# Patient Record
Sex: Male | Born: 1995 | Race: White | Hispanic: No | Marital: Single | Smoking: Never smoker
Health system: Southern US, Community
[De-identification: ages and names within clinical notes are randomized; demographics above are authoritative.]

## PROBLEM LIST (undated history)

## (undated) DIAGNOSIS — J45909 Unspecified asthma, uncomplicated: Secondary | ICD-10-CM

## (undated) DIAGNOSIS — M92529 Juvenile osteochondrosis of tibia tubercle, unspecified leg: Secondary | ICD-10-CM

## (undated) DIAGNOSIS — M9250 Juvenile osteochondrosis of tibia and fibula, unspecified leg: Secondary | ICD-10-CM

---
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
## 2019-11-01 ENCOUNTER — Emergency Department (HOSPITAL_COMMUNITY): Payer: Medicaid Other

## 2019-11-01 ENCOUNTER — Encounter (HOSPITAL_COMMUNITY): Payer: Self-pay

## 2019-11-01 ENCOUNTER — Emergency Department (HOSPITAL_COMMUNITY)
Admission: EM | Admit: 2019-11-01 | Discharge: 2019-11-01 | Disposition: A | Discharge status: Home / Self Care | Payer: Medicaid Other | Attending: Emergency Medicine | Admitting: Emergency Medicine

## 2019-11-01 ENCOUNTER — Other Ambulatory Visit: Payer: Self-pay

## 2019-11-01 DIAGNOSIS — R109 Unspecified abdominal pain: Secondary | ICD-10-CM | POA: Diagnosis present

## 2019-11-01 DIAGNOSIS — J45909 Unspecified asthma, uncomplicated: Secondary | ICD-10-CM | POA: Insufficient documentation

## 2019-11-01 DIAGNOSIS — Z20822 Contact with and (suspected) exposure to covid-19: Secondary | ICD-10-CM | POA: Insufficient documentation

## 2019-11-01 DIAGNOSIS — N50811 Right testicular pain: Secondary | ICD-10-CM | POA: Diagnosis not present

## 2019-11-01 DIAGNOSIS — E876 Hypokalemia: Secondary | ICD-10-CM | POA: Diagnosis not present

## 2019-11-01 DIAGNOSIS — R52 Pain, unspecified: Secondary | ICD-10-CM

## 2019-11-01 LAB — CBC WITH DIFFERENTIAL/PLATELET
Abs Immature Granulocytes: 0.02 10*3/uL (ref 0.00–0.07)
Basophils Absolute: 0.1 10*3/uL (ref 0.0–0.1)
Basophils Relative: 1 %
Eosinophils Absolute: 0.1 10*3/uL (ref 0.0–0.5)
Eosinophils Relative: 1 %
HCT: 42.5 % (ref 39.0–52.0)
Hemoglobin: 13.9 g/dL (ref 13.0–17.0)
Immature Granulocytes: 0 %
Lymphocytes Relative: 19 %
Lymphs Abs: 1.8 10*3/uL (ref 0.7–4.0)
MCH: 28.2 pg (ref 26.0–34.0)
MCHC: 32.7 g/dL (ref 30.0–36.0)
MCV: 86.2 fL (ref 80.0–100.0)
Monocytes Absolute: 0.4 10*3/uL (ref 0.1–1.0)
Monocytes Relative: 5 %
Neutro Abs: 7 10*3/uL (ref 1.7–7.7)
Neutrophils Relative %: 74 %
Platelets: 213 10*3/uL (ref 150–400)
RBC: 4.93 MIL/uL (ref 4.22–5.81)
RDW: 13.2 % (ref 11.5–15.5)
WBC: 9.4 10*3/uL (ref 4.0–10.5)
nRBC: 0 % (ref 0.0–0.2)

## 2019-11-01 LAB — BASIC METABOLIC PANEL
Anion gap: 14 (ref 5–15)
BUN: 11 mg/dL (ref 6–20)
CO2: 25 mmol/L (ref 22–32)
Calcium: 9.5 mg/dL (ref 8.9–10.3)
Chloride: 103 mmol/L (ref 98–111)
Creatinine, Ser: 0.87 mg/dL (ref 0.61–1.24)
GFR calc Af Amer: >60 mL/min (ref >60)
GFR calc non Af Amer: >60 mL/min (ref >60)
Glucose, Bld: 137 mg/dL — ABNORMAL HIGH (ref 70–99)
Potassium: 3.3 mmol/L — ABNORMAL LOW (ref 3.5–5.1)
Sodium: 142 mmol/L (ref 135–145)

## 2019-11-01 LAB — URINALYSIS, ROUTINE W REFLEX MICROSCOPIC
Bilirubin Urine: NEGATIVE
Glucose, UA: NEGATIVE mg/dL
Hgb urine dipstick: NEGATIVE
Ketones, ur: 5 mg/dL — AB
Leukocytes,Ua: NEGATIVE
Nitrite: NEGATIVE
Protein, ur: NEGATIVE mg/dL
Specific Gravity, Urine: 1.025 (ref 1.005–1.030)
pH: 6 (ref 5.0–8.0)

## 2019-11-01 LAB — SARS CORONAVIRUS 2 BY RT PCR (HOSPITAL ORDER, PERFORMED IN ~~LOC~~ HOSPITAL LAB): SARS Coronavirus 2: POSITIVE — AB

## 2019-11-01 MED ORDER — HYDROMORPHONE HCL 1 MG/ML IJ SOLN
0.5000 mg | Freq: Once | INTRAMUSCULAR | Status: AC
  Administered 2019-11-01: 0.5 mg via INTRAVENOUS
  Filled 2019-11-01: qty 1

## 2019-11-01 MED ORDER — KETOROLAC TROMETHAMINE 30 MG/ML IJ SOLN
30.0000 mg | Freq: Once | INTRAMUSCULAR | Status: AC
  Administered 2019-11-01: 30 mg via INTRAVENOUS
  Filled 2019-11-01: qty 1

## 2019-11-01 MED ORDER — SODIUM CHLORIDE 0.9 % IV BOLUS
1000.0000 mL | Freq: Once | INTRAVENOUS | Status: AC
  Administered 2019-11-01: 1000 mL via INTRAVENOUS

## 2019-11-01 MED ORDER — ONDANSETRON HCL 4 MG/2ML IJ SOLN
4.0000 mg | Freq: Once | INTRAMUSCULAR | Status: AC
  Administered 2019-11-01: 4 mg via INTRAVENOUS
  Filled 2019-11-01: qty 2

## 2019-11-01 MED ORDER — MORPHINE SULFATE (PF) 4 MG/ML IV SOLN
4.0000 mg | Freq: Once | INTRAVENOUS | Status: AC
  Administered 2019-11-01: 4 mg via INTRAVENOUS
  Filled 2019-11-01: qty 1

## 2019-11-01 NOTE — ED Notes (Signed)
Per Nathan Lab Tech urine culture order collected/added on to specimen previously sent. ENMiles 

## 2019-11-01 NOTE — ED Provider Notes (Signed)
[facility]-EMERGENCY DEPT Provider Note   CSN: [id number] Arrival date & time: 11/01/19  1140    History Chief Complaint  Patient presents with  . Flank Pain    Bryan Bates is a 24 y.o. male with past medical history who presents for evaluation of right flank pain. Patient states this began earlier today. No history of kidney stones. No hematuria, dysuria. Has noted some left testicular pain. No urinary symptoms. Denies midline abdominal pain radiating to his back. Pain not worse with food intake. No nausea or vomiting. Denies fever, chills, chest pain, shortness of breath, testicular redness, swelling, concerns for STDs. No rashes or lesions. Denies aggravating or relieving factors. No recent trauma or injury. He is not take anything for symptoms. He rates his current pain a 10/10.  Symptoms started at 1000 this morning  Last PO intake with oranges at 0900.  History obtained from patient and past medical records. No interpretor was used.  HPI     Past Medical History:  Diagnosis Date  . Asthma   . Osgood-Schlatter's disease     There are no problems to display for this patient.   Past Surgical History:  Procedure Laterality Date  . FACIAL LACERATION REPAIR N/A 04/22/2015   Procedure: EAR LACERATION REPAIR;  Surgeon: Melvenia Beam, MD;  Location: [facility] OR;  Service: ENT;  Laterality: N/A;       History reviewed. No pertinent family history.  Social History   Tobacco Use  . Smoking status: Never Smoker  . Smokeless tobacco: Never Used  Substance Use Topics  . Alcohol use: No  . Drug use: No    Home Medications Prior to Admission medications   Medication Sig Start Date End Date Taking? Authorizing Provider  ibuprofen (ADVIL,MOTRIN) 800 MG tablet Take 1 tablet (800 mg total) by mouth every 8 (eight) hours as needed. Patient not taking: Reported on 11/01/2019 10/07/16   Ward, Chase Picket, PA-C    Allergies    Patient has no known  allergies.  Review of Systems   Review of Systems  Constitutional: Negative.   HENT: Negative.   Respiratory: Negative.   Cardiovascular: Negative.   Gastrointestinal: Negative.   Genitourinary: Positive for flank pain and testicular pain. Negative for decreased urine volume, difficulty urinating, discharge, dysuria, frequency, genital sores, hematuria, penile pain, penile swelling, scrotal swelling and urgency.  Skin: Negative.   Neurological: Negative.   All other systems reviewed and are negative.   Physical Exam Updated Vital Signs BP 104/69   Pulse (!) 56   Temp 97.6 F (36.4 C) (Oral)   Resp 16   SpO2 99%   Physical Exam Vitals and nursing note reviewed. Exam conducted with a chaperone present.  Constitutional:      General: He is not in acute distress.    Appearance: He is well-developed. He is not ill-appearing, toxic-appearing or diaphoretic.  HENT:     Head: Normocephalic and atraumatic.     Nose: Nose normal.     Mouth/Throat:     Mouth: Mucous membranes are moist.  Eyes:     Pupils: Pupils are equal, round, and reactive to light.  Cardiovascular:     Rate and Rhythm: Normal rate and regular rhythm.     Pulses: Normal pulses.     Heart sounds: Normal heart sounds.  Pulmonary:     Effort: Pulmonary effort is normal. No respiratory distress.     Breath sounds: Normal breath sounds.  Abdominal:  General: Bowel sounds are normal. There is no distension.     Palpations: Abdomen is soft. There is no mass.     Tenderness: There is no abdominal tenderness. There is no right CVA tenderness, left CVA tenderness, guarding or rebound.     Hernia: No hernia is present.  Genitourinary:    Penis: Normal.      Testes: Normal. Cremasteric reflex is present.     Epididymis:     Right: Normal.     Left: Normal.     Comments: Minimal tenderness to right testicle. No mass. Musculoskeletal:        General: No swelling, tenderness, deformity or signs of injury. Normal  range of motion.     Cervical back: Normal range of motion and neck supple.     Right lower leg: No edema.     Left lower leg: No edema.  Skin:    General: Skin is warm and dry.     Capillary Refill: Capillary refill takes less than 2 seconds.  Neurological:     General: No focal deficit present.     Mental Status: He is alert and oriented to person, place, and time.     ED Results / Procedures / Treatments   Labs (all labs ordered are listed, but only abnormal results are displayed) Labs Reviewed  SARS CORONAVIRUS 2 BY RT PCR (HOSPITAL ORDER, PERFORMED IN [facility]) - Abnormal; Notable for the following components:      Result Value   SARS Coronavirus 2 POSITIVE (*)    All other components within normal limits  BASIC METABOLIC PANEL - Abnormal; Notable for the following components:   Potassium 3.3 (*)    Glucose, Bld 137 (*)    All other components within normal limits  URINALYSIS, ROUTINE W REFLEX MICROSCOPIC - Abnormal; Notable for the following components:   Ketones, ur 5 (*)    All other components within normal limits  URINE CULTURE  CBC WITH DIFFERENTIAL/PLATELET    EKG None  Radiology US Scrotum  Result Date: 11/01/2019 CLINICAL DATA:  Right scrotal pain EXAM: SCROTAL ULTRASOUND DOPPLER ULTRASOUND OF THE TESTICLES TECHNIQUE: Complete ultrasound examination of the testicles, epididymis, and other scrotal structures was performed. Color and spectral Doppler ultrasound were also utilized to evaluate blood flow to the testicles. COMPARISON:  None. FINDINGS: Right testicle Measurements: 4.7 x 2.1 x 3.0 cm. No mass or microlithiasis visualized. There is minimal vascular flow in the right testis. Left testicle Measurements: 5.3 x 2.4 x 2.7 cm. No mass or microlithiasis visualized. Normal appearing vascular flow seen in the left testis. Right epididymis:  Normal in size and appearance. Left epididymis:  Normal in size and appearance. Hydrocele:  None visualized.  Varicocele:  None visualized. Pulsed Doppler interrogation of the left testis testes demonstrates normal low resistance arterial and venous waveforms. Minimal vascular flow seen in the right testis. No scrotal wall thickening or abscess. IMPRESSION: Only minimal arterial flow seen in right testis. Concern for probable intermittent torsion of the right testis. Urologic assessment advised. Normal appearing low resistance waveform left testis. Study otherwise unremarkable. Critical Value/emergent results were called by telephone at the time of interpretation on 11/01/2019 at 3:15 pm to provider [facility] , who verbally acknowledged these results. Electronically Signed   By: Bretta Bang III M.D.   On: 11/01/2019 15:15   CT Renal Stone Study  Result Date: 11/01/2019 CLINICAL DATA:  24 year old male with history of right-sided flank pain. Suspected kidney stone. EXAM:  CT ABDOMEN AND PELVIS WITHOUT CONTRAST TECHNIQUE: Multidetector CT imaging of the abdomen and pelvis was performed following the standard protocol without IV contrast. COMPARISON:  No priors. FINDINGS: Lower chest: Unremarkable. Hepatobiliary: No suspicious cystic or solid hepatic lesions are confidently identified on today's noncontrast CT examination. Unenhanced appearance of the gallbladder is unremarkable. Pancreas: No definite pancreatic mass or peripancreatic fluid collections or inflammatory changes are noted on today's noncontrast CT examination. Spleen: Unremarkable. Adrenals/Urinary Tract: 2 mm nonobstructive calculus in the upper pole collecting system of left kidney. No additional calculi are noted in the right renal collecting system, along the course of either ureter, or within the lumen of the urinary bladder. There is some mild right hydroureteronephrosis. Additionally, there is effacement of the renal sinus fat associated with the right kidney suggesting inflammation. Unenhanced appearance of the left kidney and bilateral  adrenal glands is otherwise unremarkable. Unenhanced appearance of the urinary bladder is normal. Stomach/Bowel: Normal appearance of the stomach. No pathologic dilatation small bowel or colon. Normal appendix. Vascular/Lymphatic: No atherosclerotic calcifications in the abdominal aorta or pelvic vasculature. No lymphadenopathy noted in the abdomen or pelvis. Reproductive: Prostate gland and seminal vesicles are unremarkable in appearance. Other: No significant volume of ascites.  No pneumoperitoneum. Musculoskeletal: There are no aggressive appearing lytic or blastic lesions noted in the visualized portions of the skeleton. IMPRESSION: 1. Mild right hydroureteronephrosis and subtle inflammatory changes in the right renal hilum. No obstructing calcified right-sided urinary tract calculus. Correlation with urinalysis is recommended, as findings could reflect right-sided pyelonephritis. 2. 2 mm nonobstructive calculus in the upper pole collecting system of left kidney. Electronically Signed   By: Trudie Reed M.D.   On: 11/01/2019 13:45   US SCROTUM DOPPLER  Result Date: 11/01/2019 CLINICAL DATA:  Right scrotal pain EXAM: SCROTAL ULTRASOUND DOPPLER ULTRASOUND OF THE TESTICLES TECHNIQUE: Complete ultrasound examination of the testicles, epididymis, and other scrotal structures was performed. Color and spectral Doppler ultrasound were also utilized to evaluate blood flow to the testicles. COMPARISON:  None. FINDINGS: Right testicle Measurements: 4.7 x 2.1 x 3.0 cm. No mass or microlithiasis visualized. There is minimal vascular flow in the right testis. Left testicle Measurements: 5.3 x 2.4 x 2.7 cm. No mass or microlithiasis visualized. Normal appearing vascular flow seen in the left testis. Right epididymis:  Normal in size and appearance. Left epididymis:  Normal in size and appearance. Hydrocele:  None visualized. Varicocele:  None visualized. Pulsed Doppler interrogation of the left testis testes  demonstrates normal low resistance arterial and venous waveforms. Minimal vascular flow seen in the right testis. No scrotal wall thickening or abscess. IMPRESSION: Only minimal arterial flow seen in right testis. Concern for probable intermittent torsion of the right testis. Urologic assessment advised. Normal appearing low resistance waveform left testis. Study otherwise unremarkable. Critical Value/emergent results were called by telephone at the time of interpretation on 11/01/2019 at 3:15 pm to provider [facility] , who verbally acknowledged these results. Electronically Signed   By: Bretta Bang III M.D.   On: 11/01/2019 15:15    Procedures Procedures (including critical care time)  Medications Ordered in ED Medications  sodium chloride 0.9 % bolus 1,000 mL (0 mLs Intravenous Stopped 11/01/19 1440)  ondansetron (ZOFRAN) injection 4 mg (4 mg Intravenous Given 11/01/19 1238)  morphine 4 MG/ML injection 4 mg (4 mg Intravenous Given 11/01/19 1238)  ketorolac (TORADOL) 30 MG/ML injection 30 mg (30 mg Intravenous Given 11/01/19 1455)  HYDROmorphone (DILAUDID) injection 0.5 mg (0.5 mg Intravenous Given 11/01/19  1455)    ED Course  I have reviewed the triage vital signs and the nursing notes.  Pertinent labs & imaging results that were available during my care of the patient were reviewed by me and considered in my medical decision making (see chart for details).  [age] old presents for evaluation of right flank pain and right testicular pain. No history of stones. He has no urinary complaints. He has no concerns for STDs, penile discharge, rashes, lesions, testicular redness or swelling. No history of inguinal hernias. Abdomen soft, nontender. He is afebrile, nonseptic, non-ill-appearing.  No high riding testicle or gross evidence of torsion on exam.  Plan on labs, imaging and reassess  Labs and imaging personally viewed and interpreted: CBC without leukocytosis, hemoglobin 13.9 Metabolic  panel with hypokalemia at 3.3, hyperglycemia to 137, no electrolyte, renal or normality UA negative for infection US with possible intermittent torsion. CT Stone with mild right hydroureteronephrosis with subtle anti-inflammatory changes in renal hilum.  No obstructing calcified urinary calculus.  Correlate with UA is could also reflect right-sided pyelonephritis.  Patient reassessed. Pain rates an 8/10.  States he is feeling pain in his right flank as well as his right testicle.  Discussed CT findings.  Pending ultrasound.   Patient reassessed. Patient states he is having worsening right testicular pain.  Does have some mild elevation testicle on the right however reflex present.  I was called by radiology.  Patient with minimal blood flow in right testes.  Concern for intermittent torsion.  I discussed this with patient and family in room.  Will obtain Covid test, consult urology.   CONSULT with Urology Dr. Marlou PorchHerrick who will evaluate patient in ED.  [facility] [place] who will follow up on consult.   Clinical Course as of Nov 01 900  Tue Nov 01, 2019  1645 Dr. Sherrill RaringHarrick at bedside.   [JR]  1703 Dr. Marlou PorchHerrick with urology recommends symptoms today are less likely due to a testicular torsion/detorsion. Suspects patient may have recently passed a stone given the hydronephrosis on the scan. Discussed soft/low BP, suggests 2/t pt's weight and he is sleeping. He states patient is asymptomatic at this time and comfortable with discharge. He will follow up with Dr. Marlou PorchHerrick outpatient and is instructed to call his office should he need anything in the interim. Appreciate consult.    [JR]  1708 May have been positional vs pt sleeping. Evaluated, BP 109 systolic. Pt is well-apperaing, thin male, no distress. Denies pain at this time. Comfortable with plan for discharge.  BP(!): 88/51 [JR]    Clinical Course User Index [JR] Robinson, [place] N, PA-C   MDM Rules/Calculators/A&P                           Final Clinical Impression(s) / ED Diagnoses Final diagnoses:  Pain  Flank pain    Rx / DC Orders ED Discharge Orders    None       Anhelica Fowers A, PA-C 11/02/19 0902    Terrilee FilesButler, Michael C, MD 11/02/19 2038

## 2019-11-01 NOTE — ED Provider Notes (Signed)
Care assumed at shift change from PA Henderly. She her note for full H&P.  Briefly, patient presenting with flank pain radiating towards the testicle.  CT scan with concerning findings for possible torsion/detorsion.  Dr. Marlou Porch with urology to evaluate pt for possible R testicular tors/detorsion. Urology to determine disposition.  Physical Exam  BP 112/63   Pulse 65   Temp 97.6 F (36.4 C) (Oral)   Resp 18   SpO2 96%   Physical Exam Vitals and nursing note reviewed.  Constitutional:      General: He is not in acute distress.    Appearance: He is well-developed.  HENT:     Head: Normocephalic and atraumatic.  Eyes:     Conjunctiva/sclera: Conjunctivae normal.  Cardiovascular:     Rate and Rhythm: Normal rate.  Pulmonary:     Effort: Pulmonary effort is normal.  Neurological:     Mental Status: He is alert.  Psychiatric:        Mood and Affect: Mood normal.        Behavior: Behavior normal.     ED Course/Procedures   Clinical Course as of Oct 31 1833  Tue Nov 01, 2019  1645 Dr. Sherrill Raring at bedside.   [JR]  1703 Dr. Marlou Porch with urology recommends symptoms today are less likely due to a testicular torsion/detorsion. Suspects patient may have recently passed a stone given the hydronephrosis on the scan. Discussed soft/low BP, suggests 2/t pt's weight and he is sleeping. He states patient is asymptomatic at this time and comfortable with discharge. He will follow up with Dr. Marlou Porch outpatient and is instructed to call his office should he need anything in the interim. Appreciate consult.    [JR]  1708 May have been positional vs pt sleeping. Evaluated, BP 109 systolic. Pt is well-apperaing, thin male, no distress. Denies pain at this time. Comfortable with plan for discharge.  BP(!): 88/51 [JR]    Clinical Course User Index [JR] Lizette Pazos, [place] N, PA-C    Procedures Results for orders placed or performed during the hospital encounter of 11/01/19  SARS Coronavirus 2 by RT  PCR (hospital order, performed in [facility] hospital lab) Nasopharyngeal Nasopharyngeal Swab   Specimen: Nasopharyngeal Swab  Result Value Ref Range   SARS Coronavirus 2 POSITIVE (A) NEGATIVE  CBC with Differential  Result Value Ref Range   WBC 9.4 4.0 - 10.5 K/uL   RBC 4.93 4.22 - 5.81 MIL/uL   Hemoglobin 13.9 13.0 - 17.0 g/dL   HCT 40.9 39 - 52 %   MCV 86.2 80.0 - 100.0 fL   MCH 28.2 26.0 - 34.0 pg   MCHC 32.7 30.0 - 36.0 g/dL   RDW 81.1 91.4 - 78.2 %   Platelets 213 150 - 400 K/uL   nRBC 0.0 0.0 - 0.2 %   Neutrophils Relative % 74 %   Neutro Abs 7.0 1.7 - 7.7 K/uL   Lymphocytes Relative 19 %   Lymphs Abs 1.8 0.7 - 4.0 K/uL   Monocytes Relative 5 %   Monocytes Absolute 0.4 0 - 1 K/uL   Eosinophils Relative 1 %   Eosinophils Absolute 0.1 0 - 0 K/uL   Basophils Relative 1 %   Basophils Absolute 0.1 0 - 0 K/uL   Immature Granulocytes 0 %   Abs Immature Granulocytes 0.02 0.00 - 0.07 K/uL  Basic metabolic panel  Result Value Ref Range   Sodium 142 135 - 145 mmol/L   Potassium 3.3 (L) 3.5 - 5.1 mmol/L  Chloride 103 98 - 111 mmol/L   CO2 25 22 - 32 mmol/L   Glucose, Bld 137 (H) 70 - 99 mg/dL   BUN 11 6 - 20 mg/dL   Creatinine, Ser 4.16 0.61 - 1.24 mg/dL   Calcium 9.5 8.9 - 60.6 mg/dL   GFR calc non Af Amer >60 >60 mL/min   GFR calc Af Amer >60 >60 mL/min   Anion gap 14 5 - 15  Urinalysis, Routine w reflex microscopic  Result Value Ref Range   Color, Urine YELLOW YELLOW   APPearance CLEAR CLEAR   Specific Gravity, Urine 1.025 1.005 - 1.030   pH 6.0 5.0 - 8.0   Glucose, UA NEGATIVE NEGATIVE mg/dL   Hgb urine dipstick NEGATIVE NEGATIVE   Bilirubin Urine NEGATIVE NEGATIVE   Ketones, ur 5 (A) NEGATIVE mg/dL   Protein, ur NEGATIVE NEGATIVE mg/dL   Nitrite NEGATIVE NEGATIVE   Leukocytes,Ua NEGATIVE NEGATIVE   US Scrotum  Result Date: 11/01/2019 CLINICAL DATA:  Right scrotal pain EXAM: SCROTAL ULTRASOUND DOPPLER ULTRASOUND OF THE TESTICLES TECHNIQUE: Complete ultrasound  examination of the testicles, epididymis, and other scrotal structures was performed. Color and spectral Doppler ultrasound were also utilized to evaluate blood flow to the testicles. COMPARISON:  None. FINDINGS: Right testicle Measurements: 4.7 x 2.1 x 3.0 cm. No mass or microlithiasis visualized. There is minimal vascular flow in the right testis. Left testicle Measurements: 5.3 x 2.4 x 2.7 cm. No mass or microlithiasis visualized. Normal appearing vascular flow seen in the left testis. Right epididymis:  Normal in size and appearance. Left epididymis:  Normal in size and appearance. Hydrocele:  None visualized. Varicocele:  None visualized. Pulsed Doppler interrogation of the left testis testes demonstrates normal low resistance arterial and venous waveforms. Minimal vascular flow seen in the right testis. No scrotal wall thickening or abscess. IMPRESSION: Only minimal arterial flow seen in right testis. Concern for probable intermittent torsion of the right testis. Urologic assessment advised. Normal appearing low resistance waveform left testis. Study otherwise unremarkable. Critical Value/emergent results were called by telephone at the time of interpretation on 11/01/2019 at 3:15 pm to provider [facility] , who verbally acknowledged these results. Electronically Signed   By: Bretta Bang III M.D.   On: 11/01/2019 15:15   CT Renal Stone Study  Result Date: 11/01/2019 CLINICAL DATA:  24 year old male with history of right-sided flank pain. Suspected kidney stone. EXAM: CT ABDOMEN AND PELVIS WITHOUT CONTRAST TECHNIQUE: Multidetector CT imaging of the abdomen and pelvis was performed following the standard protocol without IV contrast. COMPARISON:  No priors. FINDINGS: Lower chest: Unremarkable. Hepatobiliary: No suspicious cystic or solid hepatic lesions are confidently identified on today's noncontrast CT examination. Unenhanced appearance of the gallbladder is unremarkable. Pancreas: No definite  pancreatic mass or peripancreatic fluid collections or inflammatory changes are noted on today's noncontrast CT examination. Spleen: Unremarkable. Adrenals/Urinary Tract: 2 mm nonobstructive calculus in the upper pole collecting system of left kidney. No additional calculi are noted in the right renal collecting system, along the course of either ureter, or within the lumen of the urinary bladder. There is some mild right hydroureteronephrosis. Additionally, there is effacement of the renal sinus fat associated with the right kidney suggesting inflammation. Unenhanced appearance of the left kidney and bilateral adrenal glands is otherwise unremarkable. Unenhanced appearance of the urinary bladder is normal. Stomach/Bowel: Normal appearance of the stomach. No pathologic dilatation small bowel or colon. Normal appendix. Vascular/Lymphatic: No atherosclerotic calcifications in the abdominal aorta or pelvic vasculature. No  lymphadenopathy noted in the abdomen or pelvis. Reproductive: Prostate gland and seminal vesicles are unremarkable in appearance. Other: No significant volume of ascites.  No pneumoperitoneum. Musculoskeletal: There are no aggressive appearing lytic or blastic lesions noted in the visualized portions of the skeleton. IMPRESSION: 1. Mild right hydroureteronephrosis and subtle inflammatory changes in the right renal hilum. No obstructing calcified right-sided urinary tract calculus. Correlation with urinalysis is recommended, as findings could reflect right-sided pyelonephritis. 2. 2 mm nonobstructive calculus in the upper pole collecting system of left kidney. Electronically Signed   By: Trudie Reed M.D.   On: 11/01/2019 13:45   US SCROTUM DOPPLER  Result Date: 11/01/2019 CLINICAL DATA:  Right scrotal pain EXAM: SCROTAL ULTRASOUND DOPPLER ULTRASOUND OF THE TESTICLES TECHNIQUE: Complete ultrasound examination of the testicles, epididymis, and other scrotal structures was performed. Color and  spectral Doppler ultrasound were also utilized to evaluate blood flow to the testicles. COMPARISON:  None. FINDINGS: Right testicle Measurements: 4.7 x 2.1 x 3.0 cm. No mass or microlithiasis visualized. There is minimal vascular flow in the right testis. Left testicle Measurements: 5.3 x 2.4 x 2.7 cm. No mass or microlithiasis visualized. Normal appearing vascular flow seen in the left testis. Right epididymis:  Normal in size and appearance. Left epididymis:  Normal in size and appearance. Hydrocele:  None visualized. Varicocele:  None visualized. Pulsed Doppler interrogation of the left testis testes demonstrates normal low resistance arterial and venous waveforms. Minimal vascular flow seen in the right testis. No scrotal wall thickening or abscess. IMPRESSION: Only minimal arterial flow seen in right testis. Concern for probable intermittent torsion of the right testis. Urologic assessment advised. Normal appearing low resistance waveform left testis. Study otherwise unremarkable. Critical Value/emergent results were called by telephone at the time of interpretation on 11/01/2019 at 3:15 pm to provider [facility] , who verbally acknowledged these results. Electronically Signed   By: Bretta Bang III M.D.   On: 11/01/2019 15:15    MDM  Dr. Marlou Porch with urology evaluated pt in the ED. Suggests less likely torsion given absence of testicular tenderness. Suggests more likely recently passed stone with radiating pain towards testes. He is comfortable and without complaint on my evaluation. He is comfortable with urologist plan for discharge with close outpt follow up. Return precautions discussed.        Xianna Siverling, [place], PA-C 11/01/19 Shirlean Mylar, MD 11/02/19 2008

## 2019-11-01 NOTE — Consult Note (Signed)
I have been asked to see the patient by Dr. Meridee Score, for evaluation and management of right hydronephrosis and right testicular pain.  History of present illness: 24 year old male who presented today with acute onset right flank pain.  Patient states that the pain started at work and began as a ache in her right flank.  It quickly progressed to severe debilitating sharp stabbing right-sided pain pressure complex.  He was brought to the emergency room on the way to the emergency department noticed that he started to have some dull right testicular pain as well.  He was not having any significant voiding symptoms.  He denies any dysuria hematuria.  He was having some mild nausea but no vomiting.  The patient has no history of kidney stones, but does have a family history of stones.  The emergency department a CT scan was obtained which demonstrated some mild right hydroureteronephrosis with no evidence of obstructing stones.  There was a small 2 mm nonobstructing stone in the kidney.  An ultrasound was also obtained of the scrotum and demonstrated some decreased blood flow in the right testicle.  He had a normal white blood cell count, normal kidney function, and a normal urinalysis.  He was afebrile with stable vital signs.  At the time of our initial encounter the patient is pain is completely resolved.  He had been given both Toradol and Dilaudid.  Review of systems: A 12 point comprehensive review of systems was obtained and is negative unless otherwise stated in the history of present illness.  There are no problems to display for this patient.   No current facility-administered medications on file prior to encounter.   Current Outpatient Medications on File Prior to Encounter  Medication Sig Dispense Refill  . ibuprofen (ADVIL,MOTRIN) 800 MG tablet Take 1 tablet (800 mg total) by mouth every 8 (eight) hours as needed. (Patient not taking: Reported on 11/01/2019) 21 tablet 0    Past  Medical History:  Diagnosis Date  . Asthma   . Osgood-Schlatter's disease     Past Surgical History:  Procedure Laterality Date  . FACIAL LACERATION REPAIR N/A 04/22/2015   Procedure: EAR LACERATION REPAIR;  Surgeon: Melvenia Beam, MD;  Location: [facility] OR;  Service: ENT;  Laterality: N/A;    Social History   Tobacco Use  . Smoking status: Never Smoker  . Smokeless tobacco: Never Used  Substance Use Topics  . Alcohol use: No  . Drug use: No    History reviewed. No pertinent family history.  PE: Vitals:   11/01/19 1544 11/01/19 1559 11/01/19 1614 11/01/19 1629  BP:  102/61  (!) 88/51  Pulse: (!) 53 63 (!) 50 (!) 56  Resp:    18  Temp:      TempSrc:      SpO2: 97% 100% 99% 98%   Patient appears to be in no acute distress  patient is alert and oriented x3 Atraumatic normocephalic head No cervical or supraclavicular lymphadenopathy appreciated No increased work of breathing, no audible wheezes/rhonchi Regular sinus rhythm/rate Abdomen is soft, nontender, nondistended, no CVA or suprapubic tenderness Normal type scrotal edema or testicular tenderness Lower extremities are symmetric without appreciable edema Grossly neurologically intact No identifiable skin lesions  Recent Labs    11/01/19 1213  WBC 9.4  HGB 13.9  HCT 42.5   Recent Labs    11/01/19 1213  NA 142  K 3.3*  CL 103  CO2 25  GLUCOSE 137*  BUN 11  CREATININE 0.87  CALCIUM 9.5   No results for input(s): LABPT, INR in the last 72 hours. No results for input(s): LABURIN in the last 72 hours. No results found for this or any previous visit.  Imaging: I reviewed the patient's CT scan and discussed it with the patient and his mom.  The noncontrast protocol CT scan demonstrates mild right hydroureteronephrosis with no evidence of obstructing stone.  There are some perinephric edema or stranding.  I also reviewed the patient's ultrasound which demonstrates diminished right-sided testicular flow, but  there is flow present and there are no other abnormalities.  Imp: The patient appears to have passed a small kidney stone based on his presentation with flank pain followed by scrotal pain.  His exam today is normal and his pain is well controlled.  At this point there is no identifiable pathology or etiology or other explanation for his symptoms.  Recommendations: Fortunately the patient has only symptomatic.Marland Kitchen  Recommend he follow-up with urology for repeat ultrasound in 2 weeks.  I also discussed strict return precautions.  I will get him scheduled for follow-up in our clinic with ultrasound ahead of time.  Crist Fat

## 2019-11-01 NOTE — Discharge Instructions (Signed)
Please call [facility] - Dr. Marlou Porch for any worsening symptoms or pain.  [phone]  You can take over-the-counter medications as needed for pain.

## 2019-11-01 NOTE — ED Triage Notes (Signed)
Pt presents with c/o right flank pain, no hx of kidney stones. Pt denies hematuria.

## 2019-11-03 LAB — URINE CULTURE: Culture: NO GROWTH

## 2021-04-10 IMAGING — CT CT RENAL STONE PROTOCOL
2 of 4 series · 15 of 46 positions shown, 17 images · non-contrast
Comparison: No priors.

CLINICAL DATA: 24-year-old male with history of right-sided flank
pain. Suspected kidney stone.

EXAM:
CT ABDOMEN AND PELVIS WITHOUT CONTRAST
TECHNIQUE: Multidetector CT imaging of the abdomen and pelvis was performed
following the standard protocol without IV contrast.

[Series 2: axial st · axial · 0.67mm/px · z∈[+977,+1377]mm · 12 of 90 slices shown, 14 images]
[im 5/90  soft-tissue]
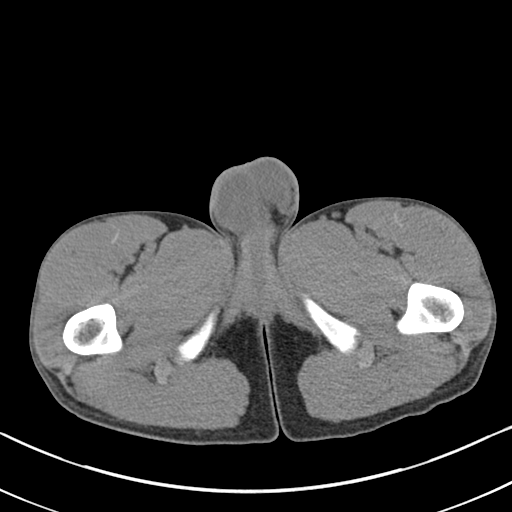
[im 5/90  bone]
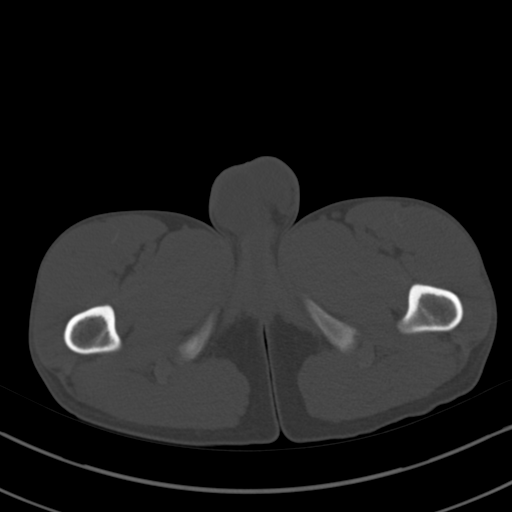
[im 15/90  soft-tissue]
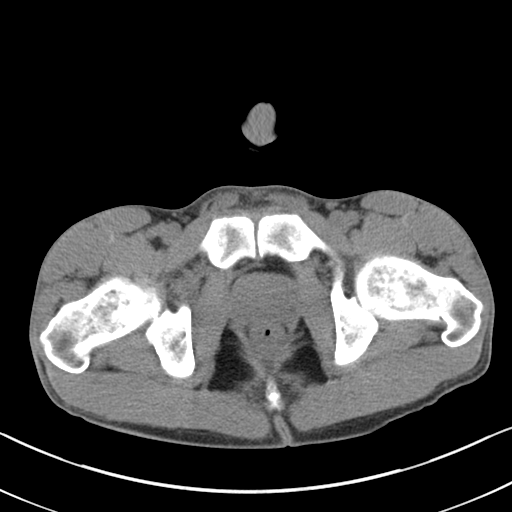
[im 19/90  soft-tissue]
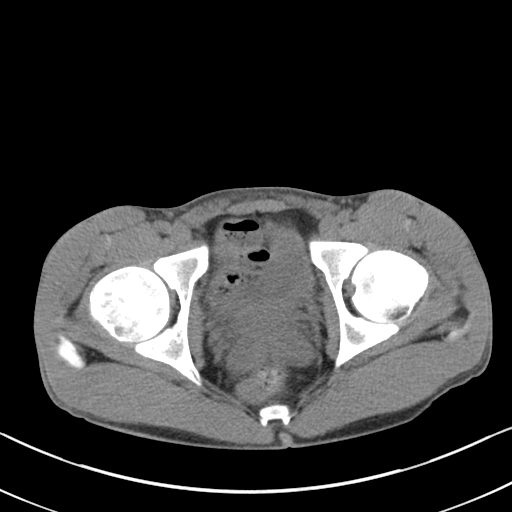
[im 29/90  soft-tissue]
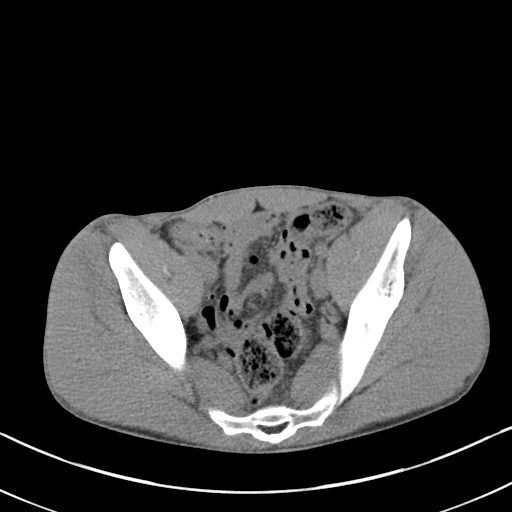
[im 33/90  soft-tissue]
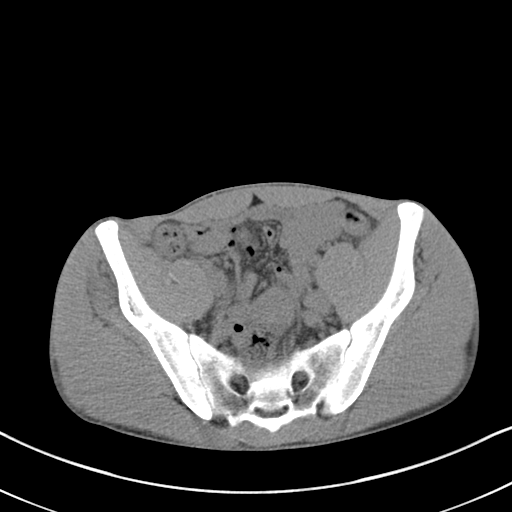
[im 43/90  soft-tissue]
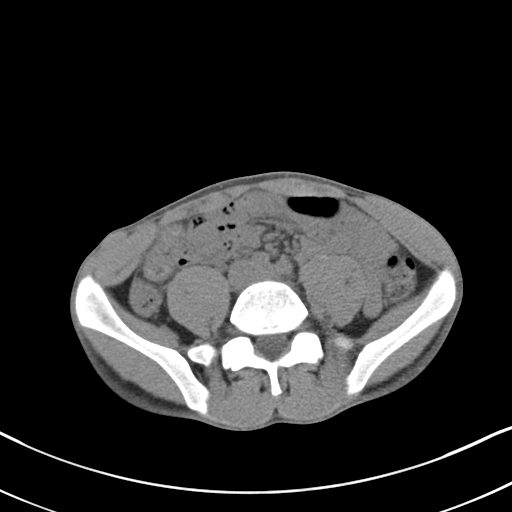
[im 47/90  soft-tissue]
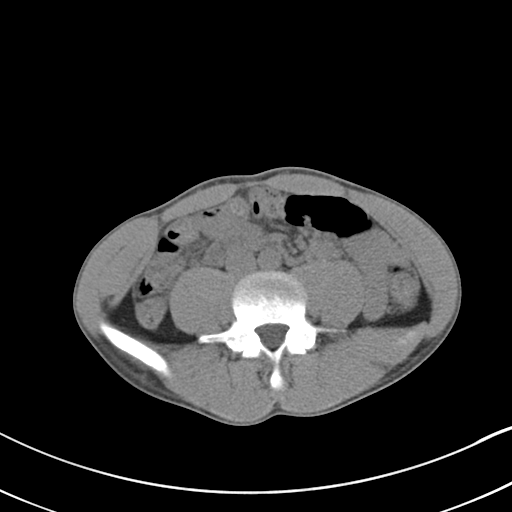
[im 57/90  soft-tissue]
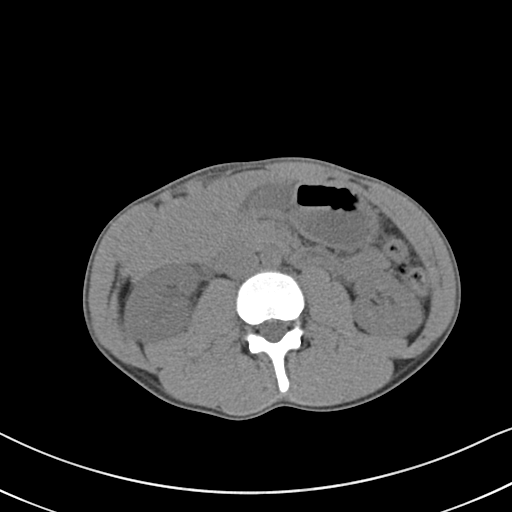
[im 61/90  soft-tissue]
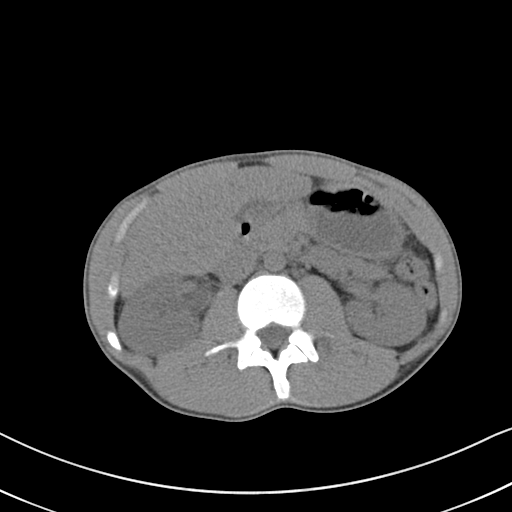
[im 61/90  bone]
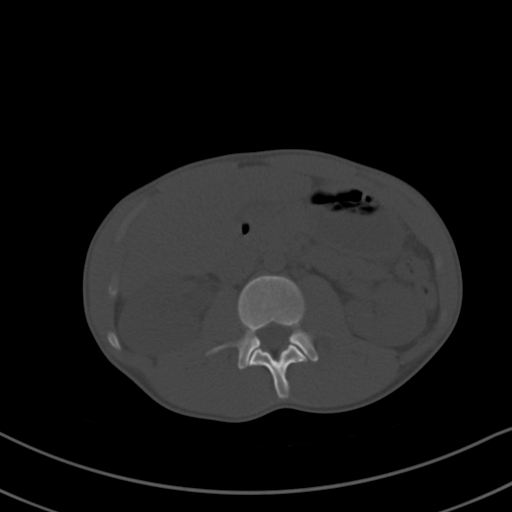
[im 71/90  soft-tissue]
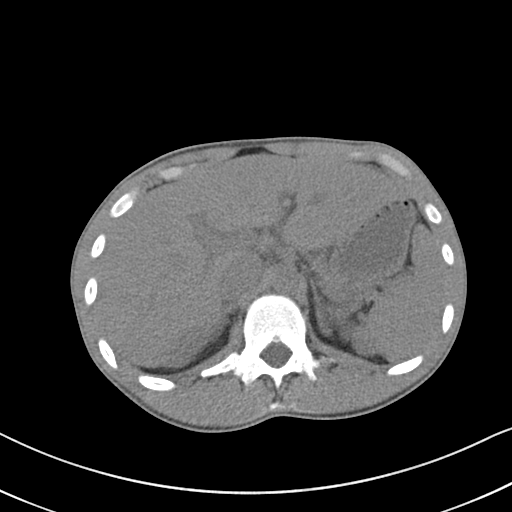
[im 75/90  soft-tissue]
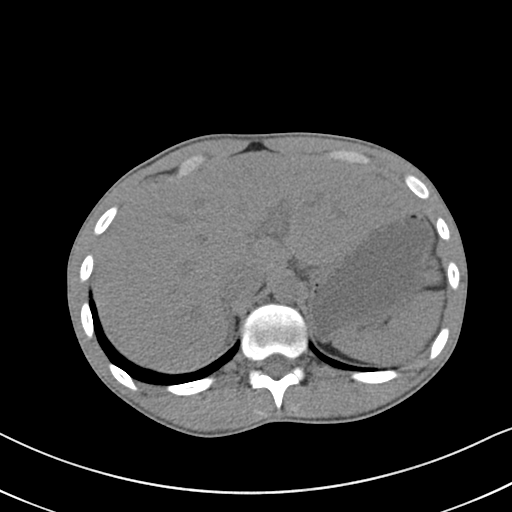
[im 85/90  soft-tissue]
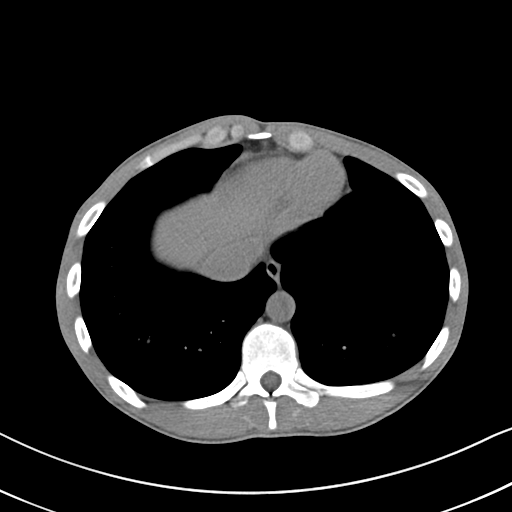

[Series 4: coronal · coronal · 0.61mm/px · 3 of 111 slices shown]
[im 37/111  soft-tissue]
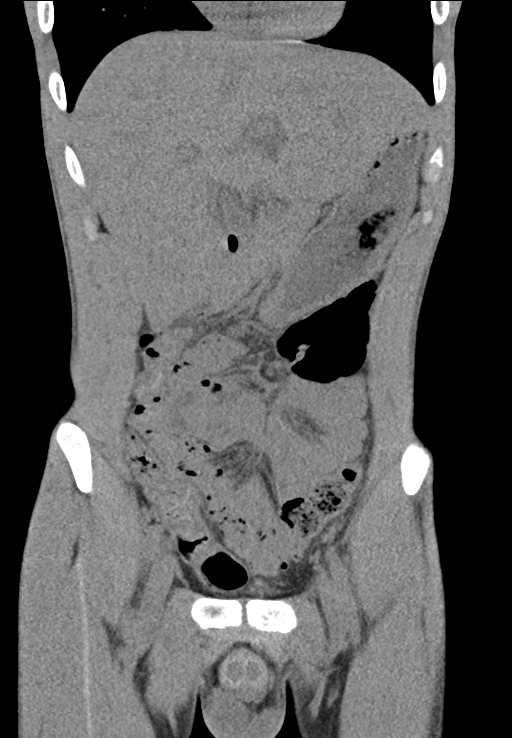
[im 49/111  soft-tissue]
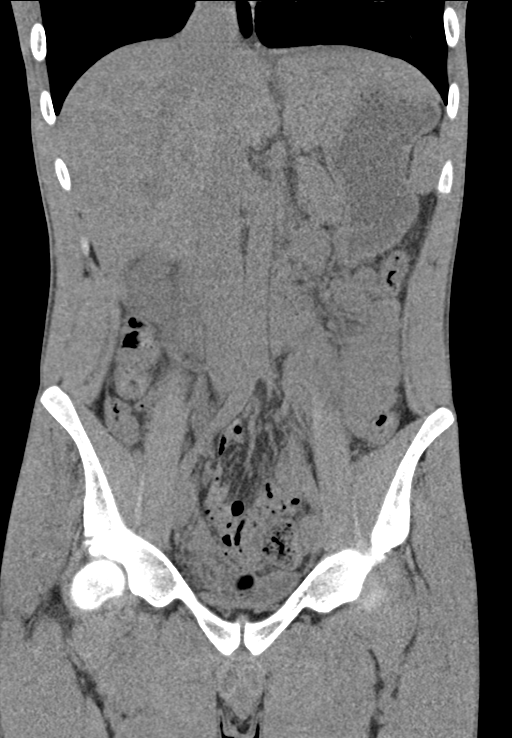
[im 62/111  soft-tissue]
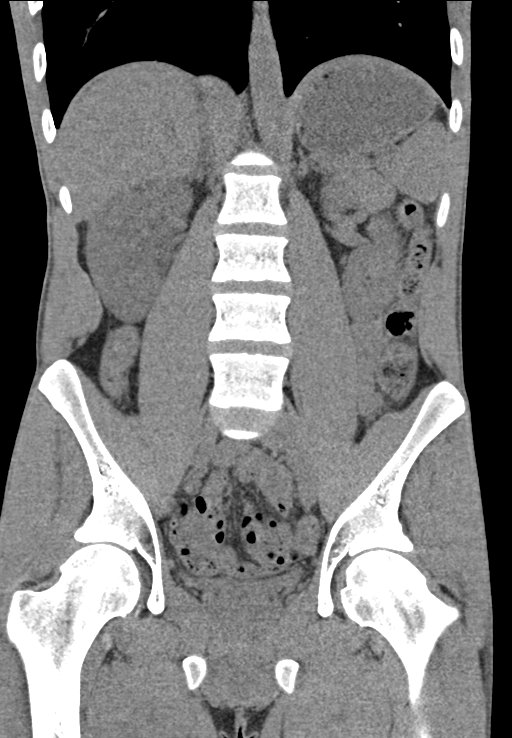

[15 of 46 positions shown; findings below may reference images not displayed]

FINDINGS: Lower chest: Unremarkable.

Hepatobiliary: No suspicious cystic or solid hepatic lesions are
confidently identified on today's noncontrast CT examination.
Unenhanced appearance of the gallbladder is unremarkable.

Pancreas: No definite pancreatic mass or peripancreatic fluid
collections or inflammatory changes are noted on today's noncontrast
CT examination.

Spleen: Unremarkable.

Adrenals/Urinary Tract: 2 mm nonobstructive calculus in the upper
pole collecting system of left kidney. No additional calculi are
noted in the right renal collecting system, along the course of
either ureter, or within the lumen of the urinary bladder. There is
some mild right hydroureteronephrosis. Additionally, there is
effacement of the renal sinus fat associated with the right kidney
suggesting inflammation. Unenhanced appearance of the left kidney
and bilateral adrenal glands is otherwise unremarkable. Unenhanced
appearance of the urinary bladder is normal.

Stomach/Bowel: Normal appearance of the stomach. No pathologic
dilatation small bowel or colon. Normal appendix.

Vascular/Lymphatic: No atherosclerotic calcifications in the
abdominal aorta or pelvic vasculature. No lymphadenopathy noted in
the abdomen or pelvis.

Reproductive: Prostate gland and seminal vesicles are unremarkable
in appearance.

Other: No significant volume of ascites.  No pneumoperitoneum.

Musculoskeletal: There are no aggressive appearing lytic or blastic
lesions noted in the visualized portions of the skeleton.
IMPRESSION: 1. Mild right hydroureteronephrosis and subtle inflammatory changes
in the right renal hilum. No obstructing calcified right-sided
urinary tract calculus. Correlation with urinalysis is recommended,
as findings could reflect right-sided pyelonephritis.
2. 2 mm nonobstructive calculus in the upper pole collecting system
of left kidney.
